# Patient Record
Sex: Female | Born: 1980 | Hispanic: Yes | Marital: Married | Smoking: Never smoker
Health system: Southern US, Community
[De-identification: ages and names within clinical notes are randomized; demographics above are authoritative.]

---

## 2016-04-11 ENCOUNTER — Encounter (HOSPITAL_COMMUNITY): Payer: Self-pay | Admitting: Nurse Practitioner

## 2016-04-11 ENCOUNTER — Emergency Department (HOSPITAL_COMMUNITY)
Admission: EM | Admit: 2016-04-11 | Discharge: 2016-04-12 | Disposition: A | Discharge status: Home / Self Care | Payer: No Typology Code available for payment source | Attending: Emergency Medicine | Admitting: Emergency Medicine

## 2016-04-11 DIAGNOSIS — M79632 Pain in left forearm: Secondary | ICD-10-CM | POA: Insufficient documentation

## 2016-04-11 DIAGNOSIS — Y92410 Unspecified street and highway as the place of occurrence of the external cause: Secondary | ICD-10-CM | POA: Diagnosis not present

## 2016-04-11 DIAGNOSIS — R0789 Other chest pain: Secondary | ICD-10-CM | POA: Diagnosis not present

## 2016-04-11 DIAGNOSIS — Y939 Activity, unspecified: Secondary | ICD-10-CM | POA: Insufficient documentation

## 2016-04-11 DIAGNOSIS — Y999 Unspecified external cause status: Secondary | ICD-10-CM | POA: Insufficient documentation

## 2016-04-11 DIAGNOSIS — M546 Pain in thoracic spine: Secondary | ICD-10-CM | POA: Diagnosis not present

## 2016-04-11 DIAGNOSIS — M542 Cervicalgia: Secondary | ICD-10-CM | POA: Diagnosis not present

## 2016-04-11 MED ORDER — IBUPROFEN 200 MG PO TABS
600.0000 mg | ORAL_TABLET | Freq: Once | ORAL | Status: AC
  Administered 2016-04-12: 600 mg via ORAL
  Filled 2016-04-11: qty 3
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
## 2016-04-12 ENCOUNTER — Emergency Department (HOSPITAL_COMMUNITY): Payer: No Typology Code available for payment source

## 2016-04-12 MED ORDER — IBUPROFEN 600 MG PO TABS: 600.0000 mg | ORAL_TABLET | Freq: Four times a day (QID) | ORAL | 0 refills | Status: AC | PRN

## 2016-04-12 MED ORDER — METHOCARBAMOL 500 MG PO TABS: 500.0000 mg | ORAL_TABLET | Freq: Two times a day (BID) | ORAL | 0 refills | Status: AC

## 2016-04-12 NOTE — Discharge Instructions (Signed)
Take your medications as prescribed. I also recommend applying ice and/or heat to affected area for 15-20 minutes 3-4 times daily for additional pain relief. Refrain from doing any heavy lifting, squatting or repetitive movements that exacerbate your symptoms. Please follow up with a primary care provider from the Resource Guide provided below in 4-5 days if your symptoms have not improved. Please return to the Emergency Department if symptoms worsen or new onset of fever, headache, chest pain, difficulty breathing, abdominal pain, numbness, tingling, groin anesthesia, loss of bowel or bladder, weakness.
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
